# Patient Record
Sex: Male | Born: 1980 | Race: White | Hispanic: No | Marital: Single | Smoking: Current every day smoker
Health system: Southern US, Community
[De-identification: ages and names within clinical notes are randomized; demographics above are authoritative.]

## PROBLEM LIST (undated history)

## (undated) DIAGNOSIS — I10 Essential (primary) hypertension: Secondary | ICD-10-CM

---

## 2017-03-10 ENCOUNTER — Emergency Department (HOSPITAL_COMMUNITY)
Admission: EM | Admit: 2017-03-10 | Discharge: 2017-03-11 | Disposition: A | Discharge status: Home / Self Care | Payer: Self-pay | Attending: Emergency Medicine | Admitting: Emergency Medicine

## 2017-03-10 ENCOUNTER — Encounter (HOSPITAL_COMMUNITY): Payer: Self-pay | Admitting: *Deleted

## 2017-03-10 ENCOUNTER — Emergency Department (HOSPITAL_COMMUNITY): Payer: Self-pay

## 2017-03-10 DIAGNOSIS — Y998 Other external cause status: Secondary | ICD-10-CM | POA: Insufficient documentation

## 2017-03-10 DIAGNOSIS — S51811A Laceration without foreign body of right forearm, initial encounter: Secondary | ICD-10-CM | POA: Insufficient documentation

## 2017-03-10 DIAGNOSIS — Y939 Activity, unspecified: Secondary | ICD-10-CM | POA: Insufficient documentation

## 2017-03-10 DIAGNOSIS — W25XXXA Contact with sharp glass, initial encounter: Secondary | ICD-10-CM | POA: Insufficient documentation

## 2017-03-10 DIAGNOSIS — I10 Essential (primary) hypertension: Secondary | ICD-10-CM | POA: Insufficient documentation

## 2017-03-10 DIAGNOSIS — Y929 Unspecified place or not applicable: Secondary | ICD-10-CM | POA: Insufficient documentation

## 2017-03-10 DIAGNOSIS — S61411A Laceration without foreign body of right hand, initial encounter: Secondary | ICD-10-CM | POA: Insufficient documentation

## 2017-03-10 HISTORY — DX: Essential (primary) hypertension: I10

## 2017-03-10 MED ORDER — HYDROCODONE-ACETAMINOPHEN 5-325 MG PO TABS
1.0000 | ORAL_TABLET | Freq: Once | ORAL | Status: AC
  Administered 2017-03-10: 1 via ORAL
  Filled 2017-03-10: qty 1

## 2017-03-10 MED ORDER — LIDOCAINE HCL (PF) 1 % IJ SOLN
30.0000 mL | Freq: Once | INTRAMUSCULAR | Status: AC
  Administered 2017-03-10: 30 mL
  Filled 2017-03-10: qty 30

## 2017-03-10 MED ORDER — BACITRACIN ZINC 500 UNIT/GM EX OINT
1.0000 "application " | TOPICAL_OINTMENT | Freq: Once | CUTANEOUS | Status: AC
  Administered 2017-03-11: 1 via TOPICAL

## 2017-03-10 NOTE — ED Provider Notes (Signed)
[facility]Y DEPT Provider Note   CSN: [id number] Arrival date & time: 03/10/17  2029     History   Chief Complaint Chief Complaint  Patient presents with  . Laceration    HPI   Blood pressure 108/69, pulse 92, temperature 98.5 F (36.9 C), resp. rate 16, height 5\' 6"  (1.676 m), weight 68 kg (150 lb), SpO2 100 %.  Perry Acosta is a 36 y.o. male brought in by EMS for laceration sustained to right hand and wrist after he punched through a glass window prior to arrival. Patient is right-hand-dominant. He doesn't work. He states that there was a pulsatile bleeding and it was in step with his heartbeat. He states that EMS had difficulty controlling the bleeding. He is not anticoagulated he states that he doesn't drink regularly. He denies any numbness, weakness. He states that his last tetanus shot was within the last 7 years. He states the pain is moderate and exacerbated by movement and palpation.   Past Medical History:  Diagnosis Date  . Hypertension     There are no active problems to display for this patient.   No past surgical history on file.     Home Medications    Prior to Admission medications   Medication Sig Start Date End Date Taking? Authorizing Provider  acetaminophen (TYLENOL) 500 MG tablet Take 1,000 mg by mouth every 6 (six) hours as needed for mild pain.   Yes [provider]    Family History No family history on file.  Social History Social History  Substance Use Topics  . Smoking status: Not on file  . Smokeless tobacco: Not on file  . Alcohol use Not on file     Allergies   Patient has no known allergies.   Review of Systems Review of Systems  A complete review of systems was obtained and all systems are negative except as noted in the HPI and PMH.    Physical Exam Updated Vital Signs BP 116/72 (BP Location: Left Arm)   Pulse 88   Temp 98.5 F (36.9 C)   Resp 16   Ht 5\' 6"  (1.676 m)   Wt 68 kg (150 lb)   SpO2 99%    BMI 24.21 kg/m   Physical Exam  Constitutional: He is oriented to person, place, and time. He appears well-developed and well-nourished. No distress.  HENT:  Head: Normocephalic and atraumatic.  Mouth/Throat: Oropharynx is clear and moist.  Eyes: Conjunctivae and EOM are normal. Pupils are equal, round, and reactive to light.  Neck: Normal range of motion.  Cardiovascular: Normal rate, regular rhythm and intact distal pulses.   Pulmonary/Chest: Effort normal and breath sounds normal.  Abdominal: Soft. There is no tenderness.  Musculoskeletal: Normal range of motion.       Arms:      Hands: Neurovacularly intact with full ROM and strength to each interphalangeal joint (tested in isolation) in both flexion and extension.   Full range of motion to wrist, patient can supinate and pronate without issue, there is no tenderness to palpation along the dorsal or volar aspect of the wrist including snuffbox.  Neurological: He is alert and oriented to person, place, and time.  Skin: He is not diaphoretic.  Multiple partial-thickness abrasions and full-thickness lacerations to the right hand and forearm as diagrammed.  Psychiatric: He has a normal mood and affect.  Nursing note and vitals reviewed.    ED Treatments / Results  Labs (all labs ordered are listed, but only  abnormal results are displayed) Labs Reviewed - No data to display  EKG  EKG Interpretation None       Radiology Dg Wrist Complete Right  Result Date: 03/11/2017 CLINICAL DATA:  Lacerations after trauma.  Pain. EXAM: RIGHT WRIST - COMPLETE 3+ VIEW COMPARISON:  None. FINDINGS: Deformity of the fifth metacarpal is consistent with a previous healed fracture. A calcification along the dorsal of the wrist on the lateral view persists. This would be a good site for a triquetrum fracture. However, the finding is well corticated and chronicity is unclear. No foreign bodies identified. IMPRESSION: 1. The calcification along the  dorsum of the wrist is in a good location for a triquetrum fracture. However, it appears relatively well corticated and chronicity is unclear. Recommend clinical correlation for pain in this region. Electronically Signed   By: Gerome Samavid  Williams III M.D   On: 03/11/2017 00:02   Dg Wrist Complete Right  Result Date: 03/10/2017 CLINICAL DATA:  Punched window shattering glass with laceration about the right hand and wrist. Concern for foreign body. EXAM: RIGHT WRIST - COMPLETE 3+ VIEW COMPARISON:  None. FINDINGS: Overlying dressing in place, no radiopaque foreign body to suggest glass. Small density about the dorsal aspect of the proximal carpal row. Otherwise no acute fracture. Navicular is intact. Wrist alignment is maintained. Remote fifth metacarpal fracture. IMPRESSION: 1. No radiopaque foreign body to suggest glass. 2. Small density about the dorsal wrist in the expected location for a triquetrum fracture, unclear whether this represents a fracture or artifact from overlying dressing. Recommend repeat wrist imaging when bleeding is controlled and dressing can be removed. Electronically Signed   By: Rubye OaksMelanie  Ehinger M.D.   On: 03/10/2017 21:43   Dg Hand Complete Right  Result Date: 03/10/2017 CLINICAL DATA:  Punched window shattering glass with laceration about the right hand and wrist. Concern for foreign body. EXAM: RIGHT HAND - COMPLETE 3+ VIEW COMPARISON:  None. FINDINGS: No fifth metacarpal fracture. Small density about the dorsal wrist in the region the proximal carpal row, unclear whether this represents a triquetrum fracture or artifact from overlying dressing. Otherwise no acute fracture. Overlying dressing about the wrist and metacarpals. No radiopaque foreign body to suggest glass. IMPRESSION: 1. No radiopaque foreign body. 2. Possible triquetrum fracture versus artifact from overlying dressing. When bleeding is controlled and dressing can be removed, recommend repeat wrist radiograph for  clarification. Electronically Signed   By: Rubye OaksMelanie  Ehinger M.D.   On: 03/10/2017 21:41    Procedures .Marland Kitchen.Laceration Repair Date/Time: 03/11/2017 12:47 AM Performed by: Wynetta EmeryPISCIOTTA, Deanta Mincey Authorized by: Wynetta EmeryPISCIOTTA, Briele Lagasse   Consent:    Consent obtained:  Verbal   Consent given by:  Patient   Risks discussed:  Infection   Alternatives discussed:  No treatment Anesthesia (see MAR for exact dosages):    Anesthesia method:  Local infiltration   Local anesthetic:  Lidocaine 1% w/o epi Laceration details:    Location:  Hand   Hand location:  R hand, dorsum   Length (cm):  2 Repair type:    Repair type:  Simple Pre-procedure details:    Preparation:  Patient was prepped and draped in usual sterile fashion Exploration:    Wound exploration: wound explored through full range of motion and entire depth of wound probed and visualized     Contaminated: no   Treatment:    Area cleansed with:  Saline   Amount of cleaning:  Extensive   Irrigation solution:  Sterile saline   Irrigation volume:  3000cc  Irrigation method:  Pressure wash   Visualized foreign bodies/material removed: no   Skin repair:    Repair method:  Sutures   Suture size:  5-0   Suture material:  Nylon   Suture technique:  Running locked   Number of sutures:  3 Approximation:    Approximation:  Close   Vermilion border: well-aligned   Post-procedure details:    Dressing:  Antibiotic ointment   Patient tolerance of procedure:  Tolerated well, no immediate complications    Date/Time: 03/11/2017 12:47 AM Performed by: Wynetta Emery Authorized by: Wynetta Emery   Consent:    Consent obtained:  Verbal   Consent given by:  Patient   Risks discussed:  Infection   Alternatives discussed:  No treatment Anesthesia (see MAR for exact dosages):    Anesthesia method:  Local infiltration   Local anesthetic:  Lidocaine 1% w/o epi Laceration details:    Location:  Hand   Hand location:  R hand, dorsum   Length  (cm):  2 Repair type:    Repair type:  Simple Pre-procedure details:    Preparation:  Patient was prepped and draped in usual sterile fashion Exploration:    Wound exploration: wound explored through full range of motion and entire depth of wound probed and visualized     Contaminated: no   Treatment:    Area cleansed with:  Saline   Amount of cleaning:  Extensive   Irrigation solution:  Sterile saline   Irrigation volume:  3000cc   Irrigation method:  Pressure wash   Visualized foreign bodies/material removed: no   Skin repair:    Repair method:  Sutures   Suture size:  5-0   Suture material:  Nylon   Suture technique:  Running locked   Number of sutures:  3 Approximation:    Approximation:  Close   Vermilion border: well-aligned   Post-procedure details:    Dressing:  Antibiotic ointment   Patient tolerance of procedure:  Tolerated well, no immediate complications  Date/Time: 03/11/2017 12:47 AM Performed by: Wynetta Emery Authorized by: Wynetta Emery   Consent:    Consent obtained:  Verbal   Consent given by:  Patient   Risks discussed:  Infection   Alternatives discussed:  No treatment Anesthesia (see MAR for exact dosages):    Anesthesia method:  Local infiltration   Local anesthetic:  Lidocaine 1% w/o epi Laceration details:    Location:  Right forearm   Length (cm):  3 Repair type:    Repair type:  Simple Pre-procedure details:    Preparation:  Patient was prepped and draped in usual sterile fashion Exploration:    Wound exploration: wound explored through full range of motion and entire depth of wound probed and visualized     Contaminated: no   Treatment:    Area cleansed with:  Saline   Amount of cleaning:  Extensive   Irrigation solution:  Sterile saline   Irrigation volume:  3000cc   Irrigation method:  Pressure wash   Visualized foreign bodies/material removed: no   Skin repair:    Repair method:  Sutures   Suture size:  5-0   Suture  material:  Nylon   Suture technique:  Running locked   Number of sutures:  4 Approximation:    Approximation:  Close   Vermilion border: well-aligned   Post-procedure details:    Dressing:  Antibiotic ointment   Patient tolerance of procedure:  Tolerated well, no immediate complications  Date/Time: 03/11/2017 12:47 AM  Performed by: Wynetta Emery Authorized by: Wynetta Emery   Consent:    Consent obtained:  Verbal   Consent given by:  Patient   Risks discussed:  Infection   Alternatives discussed:  No treatment Anesthesia (see MAR for exact dosages):    Anesthesia method:  Local infiltration   Local anesthetic:  Lidocaine 1% w/o epi Laceration details:    Location:  Right forearm   Length (cm):  3 Repair type:    Repair type:  Simple Pre-procedure details:    Preparation:  Patient was prepped and draped in usual sterile fashion Exploration:    Wound exploration: wound explored through full range of motion and entire depth of wound probed and visualized     Contaminated: no   Treatment:    Area cleansed with:  Saline   Amount of cleaning:  Extensive   Irrigation solution:  Sterile saline   Irrigation volume:  3000cc   Irrigation method:  Pressure wash   Visualized foreign bodies/material removed: no   Skin repair:    Repair method:  Sutures   Suture size:  4-0   Suture material:  Nylon   Suture technique:  Running locked   Number of sutures:  4 Approximation:    Approximation:  Close   Vermilion border: well-aligned   Post-procedure details:    Dressing:  Antibiotic ointment   Patient tolerance of procedure:  Tolerated well, no immediate complications   Medications Ordered in ED Medications  HYDROcodone-acetaminophen (NORCO/VICODIN) 5-325 MG per tablet 1 tablet (1 tablet Oral Given 03/10/17 2104)  lidocaine (PF) (XYLOCAINE) 1 % injection 30 mL (30 mLs Other Given by Other 03/10/17 2104)  bacitracin ointment 1 application (1 application Topical Given 03/11/17  0041)     Initial Impression / Assessment and Plan / ED Course  I have reviewed the triage vital signs and the nursing notes.  Pertinent labs & imaging results that were available during my care of the patient were reviewed by me and considered in my medical decision making (see chart for details).     Vitals:   03/10/17 2034 03/10/17 2147 03/11/17 0013 03/11/17 0053  BP:  105/73 108/69 116/72  Pulse:  97 92 88  Resp:   16 16  Temp:      SpO2:  95% 100% 99%  Weight: 68 kg (150 lb)     Height: 5\' 6"  (1.676 m)       Medications  HYDROcodone-acetaminophen (NORCO/VICODIN) 5-325 MG per tablet 1 tablet (1 tablet Oral Given 03/10/17 2104)  lidocaine (PF) (XYLOCAINE) 1 % injection 30 mL (30 mLs Other Given by Other 03/10/17 2104)  bacitracin ointment 1 application (1 application Topical Given 03/11/17 0041)    Perry Acosta is 36 y.o. male presenting with Multiple lacerations to right hand after he punched a window earlier in the day. Tetanus shot is up-to-date. There is no nerve or tendon involvement. Patient is reporting a pulsatile bleed consistent with arterial complication however on my exam I do not see this. Wound is irrigated and cleaned extensively with no foreign bodies appreciated. The initial x-ray obtained to look for foreign body notes a possible triquetrum  fracture. This patient has no signs or symptoms consistent with fracture.  Repeat x-ray is also suspicious suspicious for triquetrum fracture, however clinically this is incompatible. Patient is given instructions on wound care and return precautions and he verbalized understanding and teach back technique.  Evaluation does not show pathology that would require ongoing emergent intervention or inpatient treatment.  Pt is hemodynamically stable and mentating appropriately. Discussed findings and plan with patient/guardian, who agrees with care plan. All questions answered. Return precautions discussed and outpatient follow up given.      Final Clinical Impressions(s) / ED Diagnoses   Final diagnoses:  Laceration of right hand without foreign body, initial encounter    New Prescriptions Discharge Medication List as of 03/11/2017 12:43 AM       Carlton Sweaney, Mardella Layman 03/11/17 0135    Lorre Nick, MD 03/11/17 [phone]

## 2017-03-10 NOTE — ED Notes (Signed)
PA-C at bedside, suturing wounds.

## 2017-03-10 NOTE — ED Triage Notes (Signed)
The pt stuck his rt hand through a house window causing many lacerations  To rt hand and wrist.  He arrived by gems  Bandage not removed .  He was angry at his girlfriend at the time.  He admits to drinking 12 beers today and dose so every other day  Bleeding is controlled by the bandage at prewent

## 2017-03-10 NOTE — ED Notes (Signed)
Suture cart at bedside 

## 2017-03-11 NOTE — ED Notes (Signed)
Patient verbalized understanding of discharge instructions and denies any further needs or questions at this time. VS stable. Patient ambulatory with steady gait, declined wheelchair. Escorted to ED entrance where we called a cab for him.

## 2017-03-11 NOTE — Discharge Instructions (Signed)
Keep wound dry and do not remove dressing for 24 hours if possible. After that, wash gently morning and night (every 12 hours) with soap and water. Use a topical antibiotic ointment and cover with a bandaid or gauze.  °  °Do NOT use rubbing alcohol or hydrogen peroxide, do not soak the area °  °Present to your primary care doctor or the urgent care of your choice, or the ED for suture removal in 8-9 days. °  °Every attempt was made to remove foreign body (contaminants) from the wound.  However, there is always a chance that some may remain in the wound. This can  increase your risk of infection. °  °If you see signs of infection (warmth, redness, tenderness, pus, sharp increase in pain, fever, red streaking in the skin) immediately return to the emergency department. °  °After the wound heals fully, apply sunscreen for 6-12 months to minimize scarring.  ° °

## 2017-03-22 ENCOUNTER — Emergency Department (HOSPITAL_COMMUNITY)
Admission: EM | Admit: 2017-03-22 | Discharge: 2017-03-22 | Disposition: A | Discharge status: Home / Self Care | Payer: Self-pay | Attending: Emergency Medicine | Admitting: Emergency Medicine

## 2017-03-22 ENCOUNTER — Emergency Department (HOSPITAL_COMMUNITY): Payer: Self-pay

## 2017-03-22 ENCOUNTER — Encounter (HOSPITAL_COMMUNITY): Payer: Self-pay | Admitting: *Deleted

## 2017-03-22 DIAGNOSIS — Z87891 Personal history of nicotine dependence: Secondary | ICD-10-CM | POA: Insufficient documentation

## 2017-03-22 DIAGNOSIS — F10920 Alcohol use, unspecified with intoxication, uncomplicated: Secondary | ICD-10-CM | POA: Insufficient documentation

## 2017-03-22 DIAGNOSIS — I10 Essential (primary) hypertension: Secondary | ICD-10-CM | POA: Insufficient documentation

## 2017-03-22 LAB — SALICYLATE LEVEL

## 2017-03-22 LAB — COMPREHENSIVE METABOLIC PANEL
ALT: 25 U/L (ref 17–63)
ANION GAP: 8 (ref 5–15)
AST: 27 U/L (ref 15–41)
Albumin: 3.9 g/dL (ref 3.5–5.0)
Alkaline Phosphatase: 58 U/L (ref 38–126)
BILIRUBIN TOTAL: 0.3 mg/dL (ref 0.3–1.2)
BUN: <5 mg/dL — ABNORMAL LOW (ref 6–20)
CHLORIDE: 107 mmol/L (ref 101–111)
CO2: 25 mmol/L (ref 22–32)
Calcium: 8 mg/dL — ABNORMAL LOW (ref 8.9–10.3)
Creatinine, Ser: 0.84 mg/dL (ref 0.61–1.24)
Glucose, Bld: 88 mg/dL (ref 65–99)
POTASSIUM: 3.7 mmol/L (ref 3.5–5.1)
Sodium: 140 mmol/L (ref 135–145)
TOTAL PROTEIN: 6.9 g/dL (ref 6.5–8.1)

## 2017-03-22 LAB — ACETAMINOPHEN LEVEL

## 2017-03-22 LAB — CBC WITH DIFFERENTIAL/PLATELET
BASOS PCT: 0 %
Basophils Absolute: 0 10*3/uL (ref 0.0–0.1)
EOS ABS: 0.1 10*3/uL (ref 0.0–0.7)
Eosinophils Relative: 1 %
HCT: 49.1 % (ref 39.0–52.0)
HEMOGLOBIN: 16.8 g/dL (ref 13.0–17.0)
Lymphocytes Relative: 43 %
Lymphs Abs: 3.1 10*3/uL (ref 0.7–4.0)
MCH: 32.6 pg (ref 26.0–34.0)
MCHC: 34.2 g/dL (ref 30.0–36.0)
MCV: 95.3 fL (ref 78.0–100.0)
Monocytes Absolute: 0.7 10*3/uL (ref 0.1–1.0)
Monocytes Relative: 10 %
NEUTROS PCT: 46 %
Neutro Abs: 3.2 10*3/uL (ref 1.7–7.7)
PLATELETS: 199 10*3/uL (ref 150–400)
RBC: 5.15 MIL/uL (ref 4.22–5.81)
RDW: 14 % (ref 11.5–15.5)
WBC: 7.1 10*3/uL (ref 4.0–10.5)

## 2017-03-22 LAB — ETHANOL: ALCOHOL ETHYL (B): 284 mg/dL — AB (ref <5)

## 2017-03-22 MED ORDER — ACETAMINOPHEN 325 MG PO TABS
650.0000 mg | ORAL_TABLET | Freq: Once | ORAL | Status: AC
  Administered 2017-03-22: 650 mg via ORAL
  Filled 2017-03-22: qty 2

## 2017-03-22 MED ORDER — SODIUM CHLORIDE 0.9 % IV BOLUS (SEPSIS)
1000.0000 mL | Freq: Once | INTRAVENOUS | Status: AC
  Administered 2017-03-22: 1000 mL via INTRAVENOUS

## 2017-03-22 NOTE — ED Notes (Signed)
Patient ambulated independently without difficulty.

## 2017-03-22 NOTE — ED Notes (Signed)
To ct

## 2017-03-22 NOTE — ED Provider Notes (Signed)
[facility] Provider Note   CSN: [id number] Arrival date & time: 03/22/17  0544  LEVEL 5 CAVEAT - ALTERED MENTAL STATUS   History   Chief Complaint Chief Complaint  Patient presents with  . Drug Overdose    HPI Perry Acosta is a 36 y.o. male.  HPI  36 year old male presents by EMS with overdose and depressed mental status. History is limited as the patient is lethargic. EMS states that the patient was found running around naked and showed up at the neighbors house. Neighbors told EMS that the patient collapsed and fell face forward to the ground shortly after getting into their house. They put clothes on him and called 911. Patient has been lethargic the entire time with EMS. However he has had no hypoxia and has been breathing around 16 respirations per minute. No vomiting. He has briefly spoken to EMS but then falls back asleep. Because of the fall that placed him in a c-collar. EMS states they were told by the neighbors that he was doing "totem poles". He also endorses drinking 6 beers to EMS. When I talked to the patient, I get very minimal response but he shakes his head yes when asked him if he did drugs as well as yes when asked if he did alcohol but he does not elaborate.  Past Medical History:  Diagnosis Date  . Hypertension     There are no active problems to display for this patient.   History reviewed. No pertinent surgical history.     Home Medications    Prior to Admission medications   Medication Sig Start Date End Date Taking? Authorizing Provider  acetaminophen (TYLENOL) 500 MG tablet Take 1,000 mg by mouth every 6 (six) hours as needed for mild pain.    [provider]    Family History No family history on file.  Social History Social History  Substance Use Topics  . Smoking status: Current Every Day Smoker  . Smokeless tobacco: Current User  . Alcohol use Yes     Allergies   Patient has no known allergies.   Review of  Systems Review of Systems  Unable to perform ROS: Mental status change     Physical Exam Updated Vital Signs BP 93/68   Pulse 88   Temp 97.7 F (36.5 C)   Resp 17   Ht 5\' 6"  (1.676 m)   Wt 68 kg (150 lb)   SpO2 97%   BMI 24.21 kg/m   Physical Exam  Constitutional: He appears well-developed and well-nourished. He appears lethargic. Cervical collar in place.  HENT:  Head: Normocephalic and atraumatic.  Right Ear: External ear normal.  Left Ear: External ear normal.  Nose: Nose normal.  Eyes: Right eye exhibits no discharge. Left eye exhibits no discharge.  Pupils are small but not pinpoint. Reactive.  Neck: Neck supple.  Cardiovascular: Normal rate, regular rhythm and normal heart sounds.   Pulmonary/Chest: Effort normal and breath sounds normal. He has no wheezes. He has no rales.  RR 16  Abdominal: Soft. He exhibits no distension. There is no tenderness.  Musculoskeletal: He exhibits no edema.  Neurological: He appears lethargic. GCS eye subscore is 2. GCS verbal subscore is 4. GCS motor subscore is 5.  Lethargic, awakens to pain, some slurred verbal response. Moves all 4 extremities to pain and intermittently.  Skin: Skin is warm and dry.  Nursing note and vitals reviewed.    ED Treatments / Results  Labs (all labs ordered are listed,  but only abnormal results are displayed) Labs Reviewed  COMPREHENSIVE METABOLIC PANEL - Abnormal; Notable for the following:       Result Value   BUN <5 (*)    Calcium 8.0 (*)    All other components within normal limits  ETHANOL - Abnormal; Notable for the following:    Alcohol, Ethyl (B) 284 (*)    All other components within normal limits  ACETAMINOPHEN LEVEL - Abnormal; Notable for the following:    Acetaminophen (Tylenol), Serum <10 (*)    All other components within normal limits  SALICYLATE LEVEL  CBC WITH DIFFERENTIAL/PLATELET  RAPID URINE DRUG SCREEN, HOSP PERFORMED  CBG MONITORING, ED    EKG  EKG  Interpretation  Date/Time:  Friday March 22 2017 05:50:15 EDT Ventricular Rate:  88 PR Interval:    QRS Duration: 104 QT Interval:  362 QTC Calculation: 438 R Axis:   131 Text Interpretation:  Sinus rhythm Left atrial enlargement Right axis deviation Baseline wander in lead(s) V2 No old tracing to compare Confirmed by Pricilla Loveless [phone]) on 03/22/2017 6:18:50 AM       Radiology Ct Head Wo Contrast  Result Date: 03/22/2017 CLINICAL DATA:  Fall with altered mental status. Initial encounter. EXAM: CT HEAD WITHOUT CONTRAST CT CERVICAL SPINE WITHOUT CONTRAST TECHNIQUE: Multidetector CT imaging of the head and cervical spine was performed following the standard protocol without intravenous contrast. Multiplanar CT image reconstructions of the cervical spine were also generated. COMPARISON:  None. FINDINGS: CT HEAD FINDINGS Brain: No evidence of acute infarction, hemorrhage, hydrocephalus, extra-axial collection or mass lesion/mass effect. Vascular: No hyperdense vessel or unexpected calcification. Skull: Normal. Negative for fracture or focal lesion. Sinuses/Orbits: No acute finding. CT CERVICAL SPINE FINDINGS Alignment: Normal. Skull base and vertebrae: Negative for fracture Soft tissues and spinal canal: No prevertebral fluid or swelling. No visible canal hematoma. Disc levels:  No degenerative changes Upper chest: Mild subpleural emphysematous changes IMPRESSION: No evidence of intracranial or cervical spine injury. Normal appearance of the brain. Electronically Signed   By: Marnee Spring M.D.   On: 03/22/2017 06:53   Ct Cervical Spine Wo Contrast  Result Date: 03/22/2017 CLINICAL DATA:  Fall with altered mental status. Initial encounter. EXAM: CT HEAD WITHOUT CONTRAST CT CERVICAL SPINE WITHOUT CONTRAST TECHNIQUE: Multidetector CT imaging of the head and cervical spine was performed following the standard protocol without intravenous contrast. Multiplanar CT image reconstructions of the  cervical spine were also generated. COMPARISON:  None. FINDINGS: CT HEAD FINDINGS Brain: No evidence of acute infarction, hemorrhage, hydrocephalus, extra-axial collection or mass lesion/mass effect. Vascular: No hyperdense vessel or unexpected calcification. Skull: Normal. Negative for fracture or focal lesion. Sinuses/Orbits: No acute finding. CT CERVICAL SPINE FINDINGS Alignment: Normal. Skull base and vertebrae: Negative for fracture Soft tissues and spinal canal: No prevertebral fluid or swelling. No visible canal hematoma. Disc levels:  No degenerative changes Upper chest: Mild subpleural emphysematous changes IMPRESSION: No evidence of intracranial or cervical spine injury. Normal appearance of the brain. Electronically Signed   By: Marnee Spring M.D.   On: 03/22/2017 06:53   Dg Chest Portable 1 View  Result Date: 03/22/2017 CLINICAL DATA:  Altered mental status.  Fall. EXAM: PORTABLE CHEST 1 VIEW COMPARISON:  None. FINDINGS: Normal heart size and mediastinal contours for technique. Low lung volumes with interstitial crowding. No evidence of hemothorax, pneumothorax, or lung contusion. No visible fracture. IMPRESSION: Negative portable chest. Electronically Signed   By: Marnee Spring M.D.   On: 03/22/2017 06:56  Procedures Procedures (including critical care time)  Medications Ordered in ED Medications  sodium chloride 0.9 % bolus 1,000 mL (0 mLs Intravenous Stopped 03/22/17 9528)     Initial Impression / Assessment and Plan / ED Course  I have reviewed the triage vital signs and the nursing notes.  Pertinent labs & imaging results that were available during my care of the patient were reviewed by me and considered in my medical decision making (see chart for details).     Patient is maintaining airway and has sufficient respirations without hypoxia. At this time he is stable, will observe and rule out other causes of AMS or injuries. If he improves, will likely be discharged  unless this was intentional. Likely accidental overdose on ETOH, xanax, etc. Care to Dr. Madilyn Hook.  Final Clinical Impressions(s) / ED Diagnoses   Final diagnoses:  None    New Prescriptions New Prescriptions   No medications on file     Pricilla Loveless, MD 03/22/17 [phone]

## 2017-03-22 NOTE — ED Provider Notes (Signed)
0700 Pt in ED with alcohol intoxication, syncopal event.  Pt somnolent but responds to painful stimuli.  Plan to monitor in ED and reassess.    On repeat assessment patient is awake and alert, complains of mild headache, is clinically sober. He states he was drinking alcohol last night and denies any drug use. Counseled patient on alcohol use and abuse. Discussed home care, outpatient follow-up and return precautions.    Tilden Fossaees, Edison Nicholson, MD 03/22/17 1256

## 2017-03-22 NOTE — ED Notes (Signed)
Pt awake up drinking a coke , alert and oriented

## 2017-03-22 NOTE — ED Triage Notes (Signed)
The pt [place] by [address] from the [address].  He was running naked around his neighborhood  His neighbors  Placed him in clothes and thenh he fell onto the ground.  He admits to drinking beer and taking something else   Responds to painful stimuli

## 2017-03-22 NOTE — ED Notes (Signed)
cbg [id number] 

## 2017-03-22 NOTE — ED Notes (Signed)
Pt to c-t and returned  Port chest xray

## 2019-03-28 IMAGING — CT CT HEAD W/O CM
5 of 8 series · 17 of 47 positions shown, 19 images · non-contrast
Comparison: None.

CLINICAL DATA: Fall with altered mental status. Initial encounter.

EXAM:
CT HEAD WITHOUT CONTRAST
CT CERVICAL SPINE WITHOUT CONTRAST
TECHNIQUE: Multidetector CT imaging of the head and cervical spine was
performed following the standard protocol without intravenous
contrast. Multiplanar CT image reconstructions of the cervical spine
were also generated.

[Series 4: head wo · axial · 0.41mm/px · z∈[-108,-52]mm · 2 of 33 slices shown]
[im 11/33  brain]
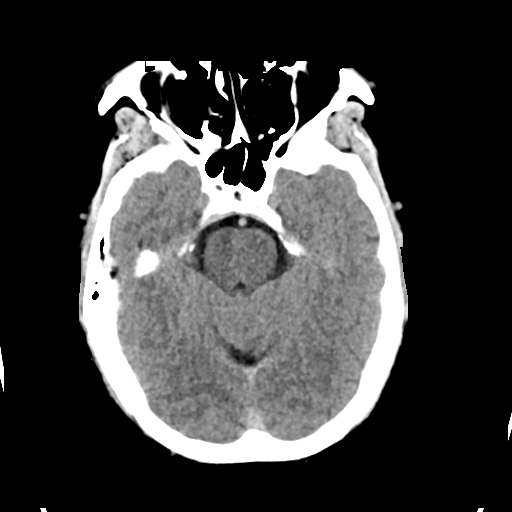
[im 22/33  brain]
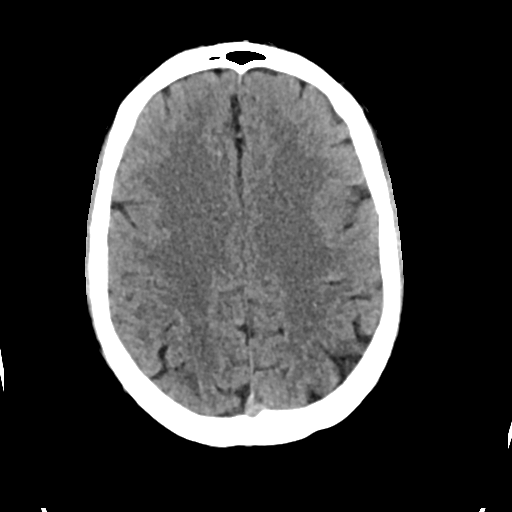

[Series 5: head bone · axial · 0.41mm/px · z∈[-136,-66]mm · 4 of 82 slices shown]
[im 12/82  bone]
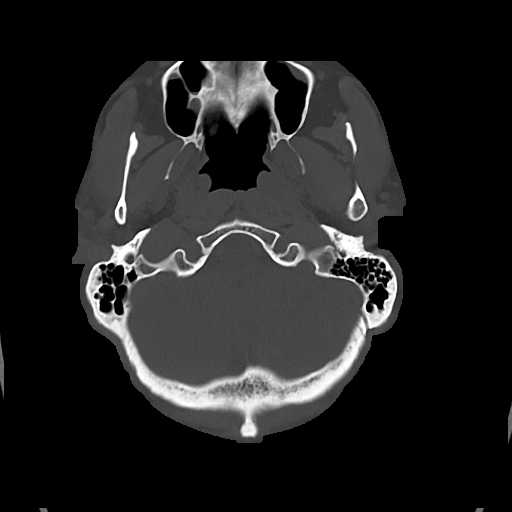
[im 24/82  bone]
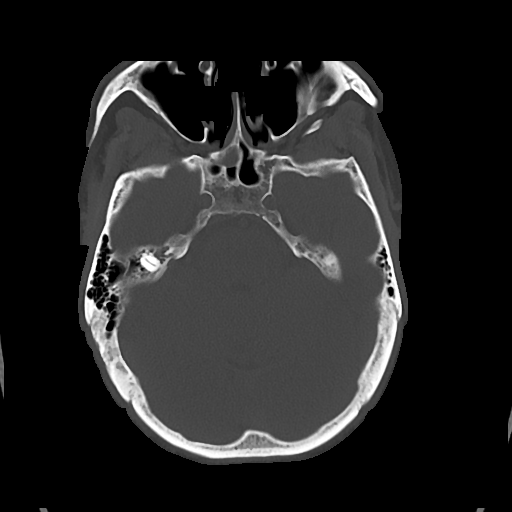
[im 35/82  bone]
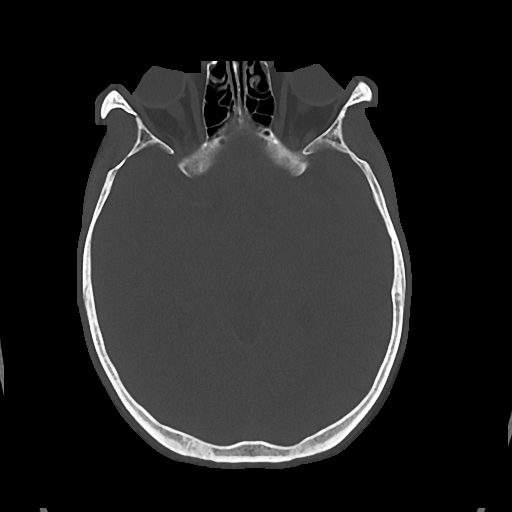
[im 47/82  bone]
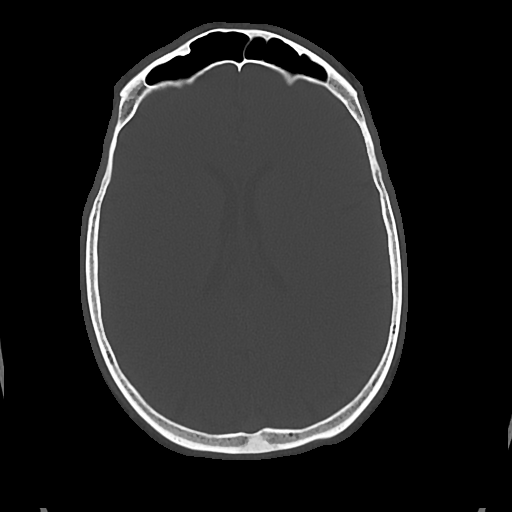

[Series 6: cor soft · coronal · 0.33mm/px · 3 of 67 slices shown]
[im 19/67  brain]
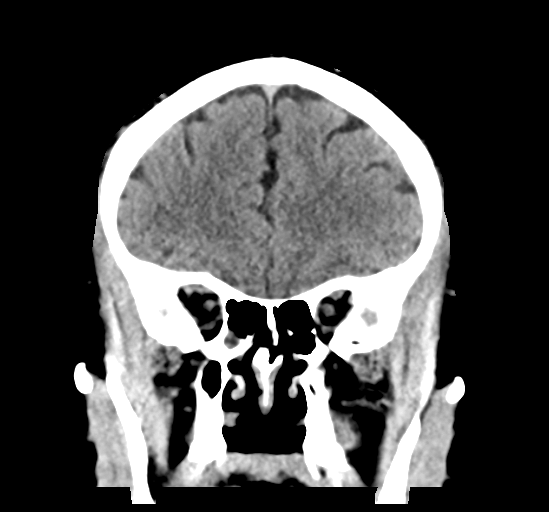
[im 29/67  brain]
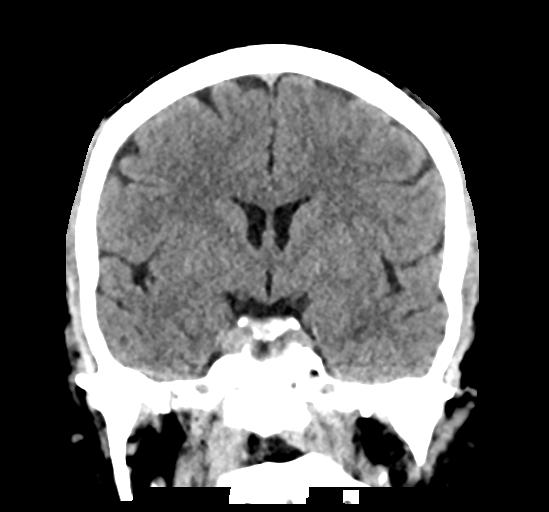
[im 38/67  brain]
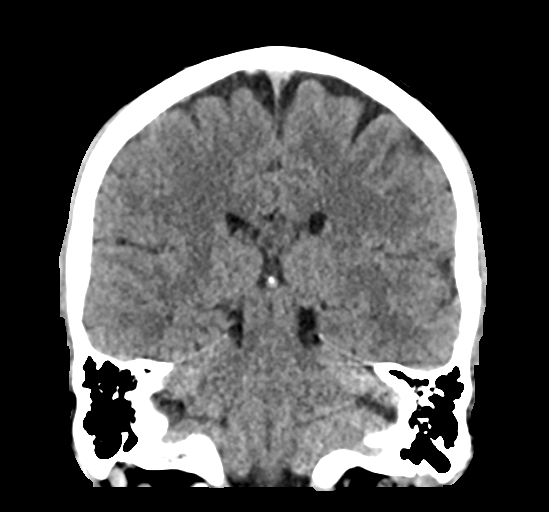

[Series 7: sag soft · sagittal · 0.34mm/px · 1 of 58 slices shown]
[im 29/58  brain]
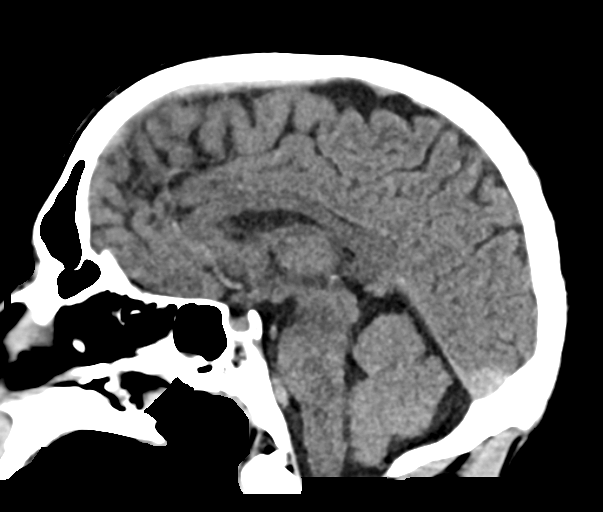

[Series 12: orthogonal axials · axial · 0.21mm/px · z∈[-300,-163]mm · 7 of 95 slices shown, 9 images]
[im 12/95  brain]
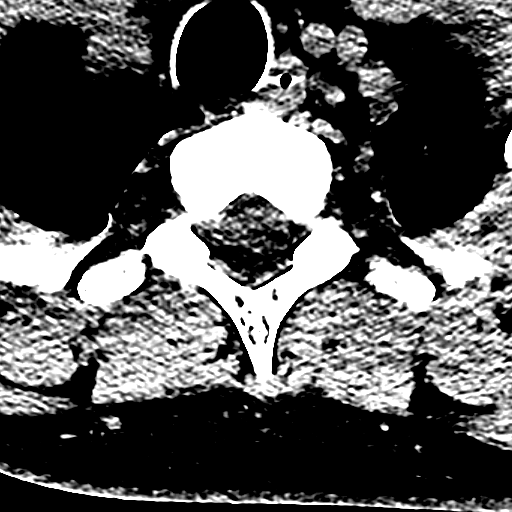
[im 12/95  bone]
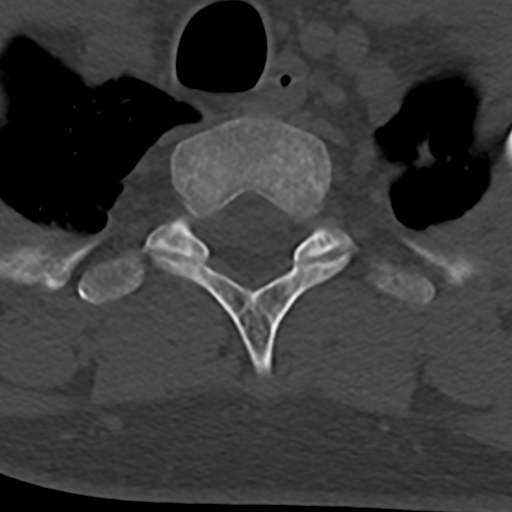
[im 24/95  brain]
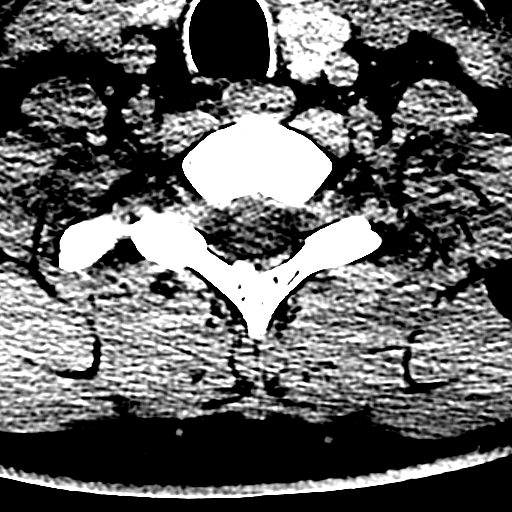
[im 36/95  brain]
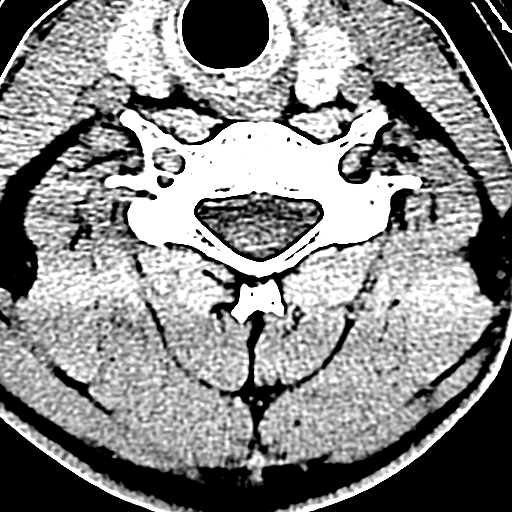
[im 48/95  brain]
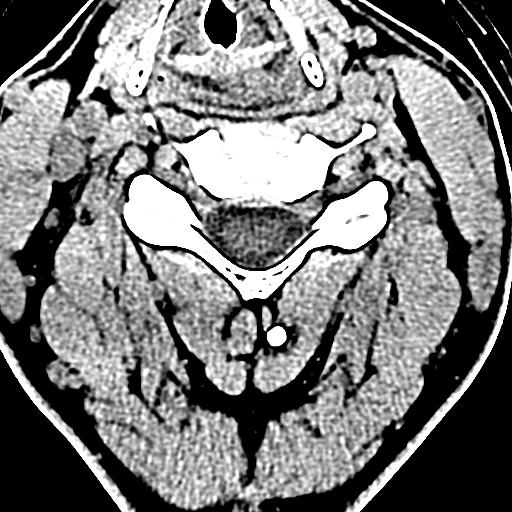
[im 59/95  brain]
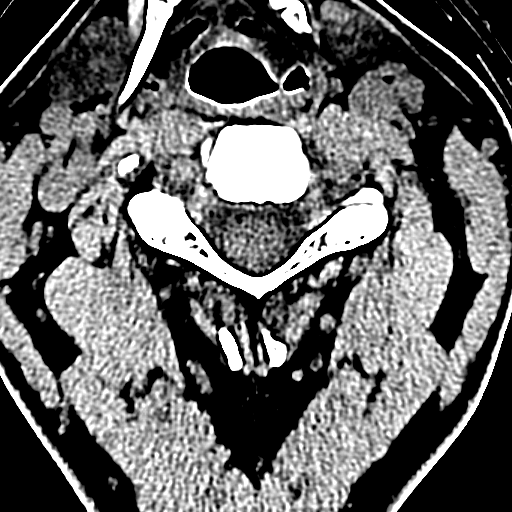
[im 59/95  bone]
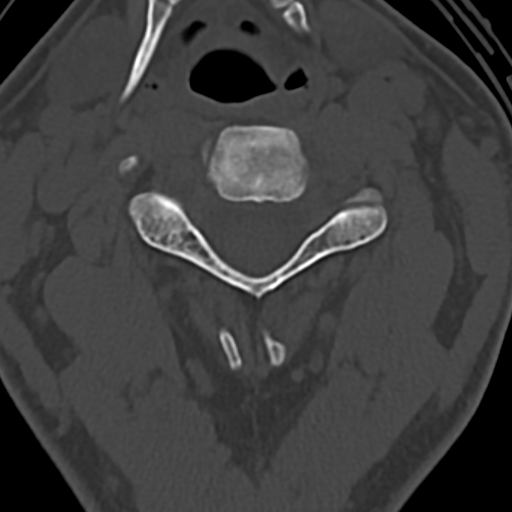
[im 71/95  brain]
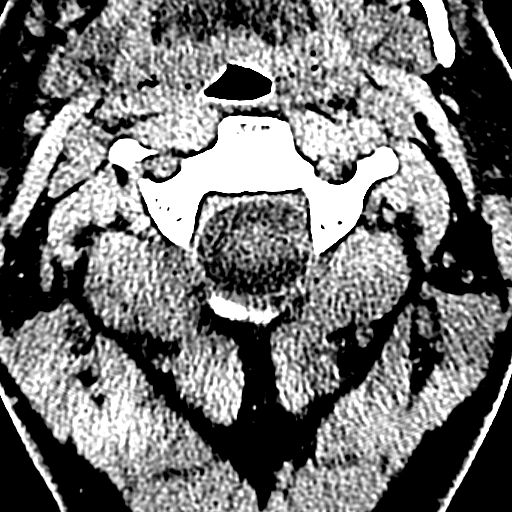
[im 83/95  brain]
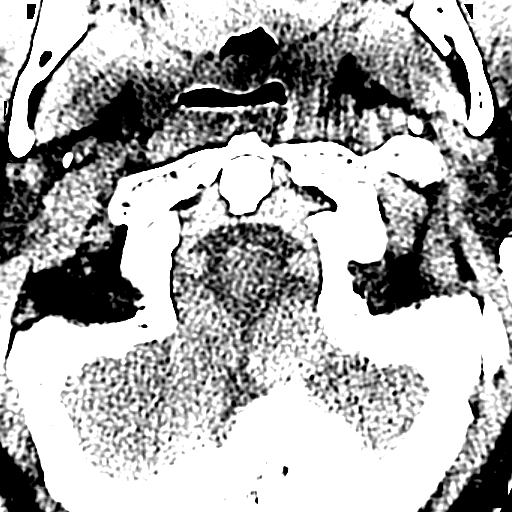

[17 of 47 positions shown; findings below may reference images not displayed]

FINDINGS: CT HEAD FINDINGS

Brain: No evidence of acute infarction, hemorrhage, hydrocephalus,
extra-axial collection or mass lesion/mass effect.

Vascular: No hyperdense vessel or unexpected calcification.

Skull: Normal. Negative for fracture or focal lesion.

Sinuses/Orbits: No acute finding.

CT CERVICAL SPINE FINDINGS

Alignment: Normal.

Skull base and vertebrae: Negative for fracture

Soft tissues and spinal canal: No prevertebral fluid or swelling. No
visible canal hematoma.

Disc levels:  No degenerative changes

Upper chest: Mild subpleural emphysematous changes
IMPRESSION: No evidence of intracranial or cervical spine injury. Normal
appearance of the brain.
